# Patient Record
Sex: Male | Born: 1975 | Race: White | Hispanic: No | Marital: Single | Smoking: Current every day smoker
Health system: Southern US, Community
[De-identification: ages and names within clinical notes are randomized; demographics above are authoritative.]

## PROBLEM LIST (undated history)

## (undated) DIAGNOSIS — M1990 Unspecified osteoarthritis, unspecified site: Secondary | ICD-10-CM

---

## 1997-12-06 ENCOUNTER — Emergency Department (HOSPITAL_COMMUNITY): Admission: EM | Admit: 1997-12-06 | Discharge: 1997-12-06 | Payer: Self-pay | Admitting: Emergency Medicine

## 2001-01-31 ENCOUNTER — Emergency Department (HOSPITAL_COMMUNITY): Admission: EM | Admit: 2001-01-31 | Discharge: 2001-01-31 | Payer: Self-pay | Admitting: *Deleted

## 2001-09-05 ENCOUNTER — Encounter: Payer: Self-pay | Admitting: Emergency Medicine

## 2001-09-05 ENCOUNTER — Emergency Department (HOSPITAL_COMMUNITY): Admission: EM | Admit: 2001-09-05 | Discharge: 2001-09-05 | Payer: Self-pay | Admitting: Emergency Medicine

## 2001-10-02 ENCOUNTER — Emergency Department (HOSPITAL_COMMUNITY): Admission: EM | Admit: 2001-10-02 | Discharge: 2001-10-02 | Payer: Self-pay | Admitting: Emergency Medicine

## 2002-03-25 ENCOUNTER — Encounter: Payer: Self-pay | Admitting: Emergency Medicine

## 2002-03-25 ENCOUNTER — Emergency Department (HOSPITAL_COMMUNITY): Admission: EM | Admit: 2002-03-25 | Discharge: 2002-03-26 | Payer: Self-pay | Admitting: *Deleted

## 2002-03-30 ENCOUNTER — Emergency Department (HOSPITAL_COMMUNITY): Admission: EM | Admit: 2002-03-30 | Discharge: 2002-03-30 | Payer: Self-pay | Admitting: Emergency Medicine

## 2002-09-19 ENCOUNTER — Encounter: Payer: Self-pay | Admitting: Internal Medicine

## 2002-09-19 ENCOUNTER — Encounter: Payer: Self-pay | Admitting: Emergency Medicine

## 2002-09-19 ENCOUNTER — Inpatient Hospital Stay (HOSPITAL_COMMUNITY): Admission: EM | Admit: 2002-09-19 | Discharge: 2002-09-23 | Payer: Self-pay | Admitting: Emergency Medicine

## 2002-12-03 ENCOUNTER — Inpatient Hospital Stay (HOSPITAL_COMMUNITY): Admission: EM | Admit: 2002-12-03 | Discharge: 2002-12-06 | Payer: Self-pay | Admitting: Psychiatry

## 2003-02-07 ENCOUNTER — Encounter: Payer: Self-pay | Admitting: Emergency Medicine

## 2003-02-07 ENCOUNTER — Inpatient Hospital Stay (HOSPITAL_COMMUNITY): Admission: EM | Admit: 2003-02-07 | Discharge: 2003-02-10 | Payer: Self-pay | Admitting: Emergency Medicine

## 2003-02-08 ENCOUNTER — Encounter: Payer: Self-pay | Admitting: Internal Medicine

## 2003-03-03 ENCOUNTER — Emergency Department (HOSPITAL_COMMUNITY): Admission: EM | Admit: 2003-03-03 | Discharge: 2003-03-03 | Payer: Self-pay | Admitting: Emergency Medicine

## 2003-03-03 ENCOUNTER — Encounter: Payer: Self-pay | Admitting: Emergency Medicine

## 2003-04-11 ENCOUNTER — Emergency Department (HOSPITAL_COMMUNITY): Admission: EM | Admit: 2003-04-11 | Discharge: 2003-04-12 | Payer: Self-pay | Admitting: Emergency Medicine

## 2003-05-31 ENCOUNTER — Inpatient Hospital Stay (HOSPITAL_COMMUNITY): Admission: EM | Admit: 2003-05-31 | Discharge: 2003-06-03 | Payer: Self-pay | Admitting: Emergency Medicine

## 2003-08-08 ENCOUNTER — Emergency Department (HOSPITAL_COMMUNITY): Admission: EM | Admit: 2003-08-08 | Discharge: 2003-08-09 | Payer: Self-pay | Admitting: Unknown Physician Specialty

## 2003-09-18 ENCOUNTER — Emergency Department (HOSPITAL_COMMUNITY): Admission: EM | Admit: 2003-09-18 | Discharge: 2003-09-18 | Payer: Self-pay | Admitting: Emergency Medicine

## 2003-10-03 ENCOUNTER — Emergency Department (HOSPITAL_COMMUNITY): Admission: EM | Admit: 2003-10-03 | Discharge: 2003-10-03 | Payer: Self-pay | Admitting: *Deleted

## 2003-10-04 ENCOUNTER — Emergency Department (HOSPITAL_COMMUNITY): Admission: EM | Admit: 2003-10-04 | Discharge: 2003-10-04 | Payer: Self-pay | Admitting: Emergency Medicine

## 2003-10-14 ENCOUNTER — Emergency Department (HOSPITAL_COMMUNITY): Admission: EM | Admit: 2003-10-14 | Discharge: 2003-10-14 | Payer: Self-pay | Admitting: Emergency Medicine

## 2003-11-03 ENCOUNTER — Emergency Department (HOSPITAL_COMMUNITY): Admission: EM | Admit: 2003-11-03 | Discharge: 2003-11-03 | Payer: Self-pay | Admitting: Emergency Medicine

## 2003-11-16 ENCOUNTER — Emergency Department (HOSPITAL_COMMUNITY): Admission: EM | Admit: 2003-11-16 | Discharge: 2003-11-16 | Payer: Self-pay | Admitting: Emergency Medicine

## 2003-11-17 ENCOUNTER — Emergency Department (HOSPITAL_COMMUNITY): Admission: EM | Admit: 2003-11-17 | Discharge: 2003-11-17 | Payer: Self-pay | Admitting: Emergency Medicine

## 2003-11-19 ENCOUNTER — Emergency Department (HOSPITAL_COMMUNITY): Admission: EM | Admit: 2003-11-19 | Discharge: 2003-11-19 | Payer: Self-pay | Admitting: Family Medicine

## 2003-12-25 ENCOUNTER — Emergency Department (HOSPITAL_COMMUNITY): Admission: EM | Admit: 2003-12-25 | Discharge: 2003-12-25 | Payer: Self-pay | Admitting: Emergency Medicine

## 2004-01-06 ENCOUNTER — Emergency Department (HOSPITAL_COMMUNITY): Admission: EM | Admit: 2004-01-06 | Discharge: 2004-01-06 | Payer: Self-pay | Admitting: Emergency Medicine

## 2004-01-12 ENCOUNTER — Inpatient Hospital Stay (HOSPITAL_COMMUNITY): Admission: EM | Admit: 2004-01-12 | Discharge: 2004-01-16 | Payer: Self-pay | Admitting: Emergency Medicine

## 2006-01-24 IMAGING — CR DG KNEE COMPLETE 4+V*L*
4 series · 4 of 4 positions shown · non-contrast
Comparison: none

CLINICAL DATA: Left knee pain and instability, fall last night.
 COMPLETE LEFT KNEE 10/04/03 
 Four views of the left knee demonstrate a small to moderate-sized effusion.  Also demonstrated is an avulsion fracture of the medial aspect of the distal femoral metaphysis.  It is difficult to determine if this is an acute fracture or an old fracture.  On one of the oblique views, this appears to represent an old, healed fracture.  Minimal medial spur formation is also noted. 
 IMPRESSION
 1.  Probable old avulsion fracture of the medial aspect of the distal femoral metaphysis.  An acute fracture is less likely.  Correlation with the clinical findings is necessary.
 2.  Small to moderate-sized effusion. 
 3.  Minimal medial spur formation.

[view not recorded (1 of 4)]
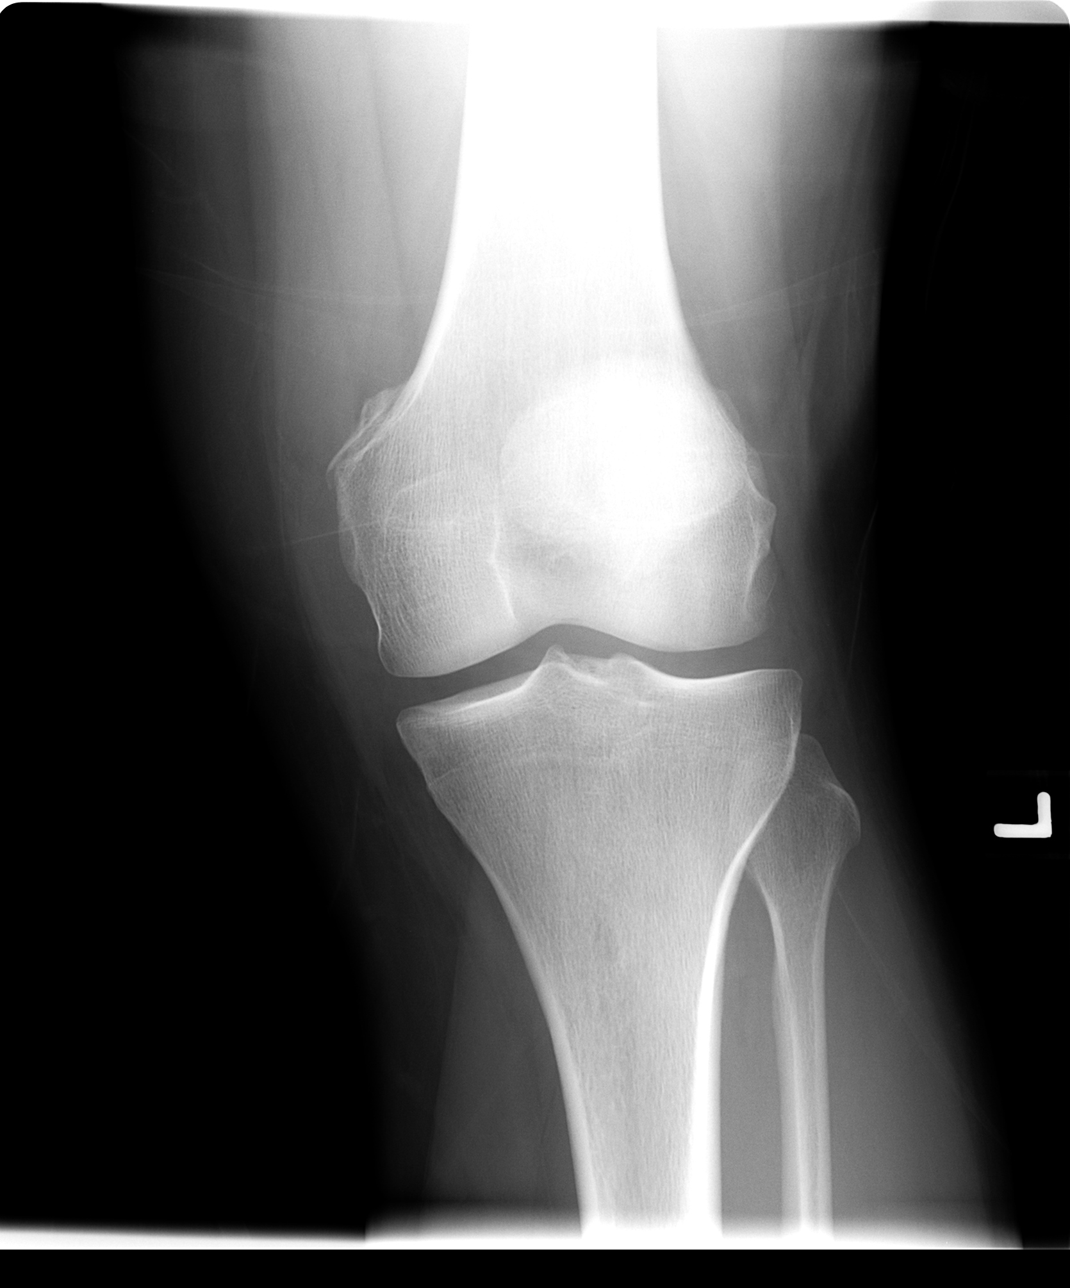

[view not recorded (2 of 4)]
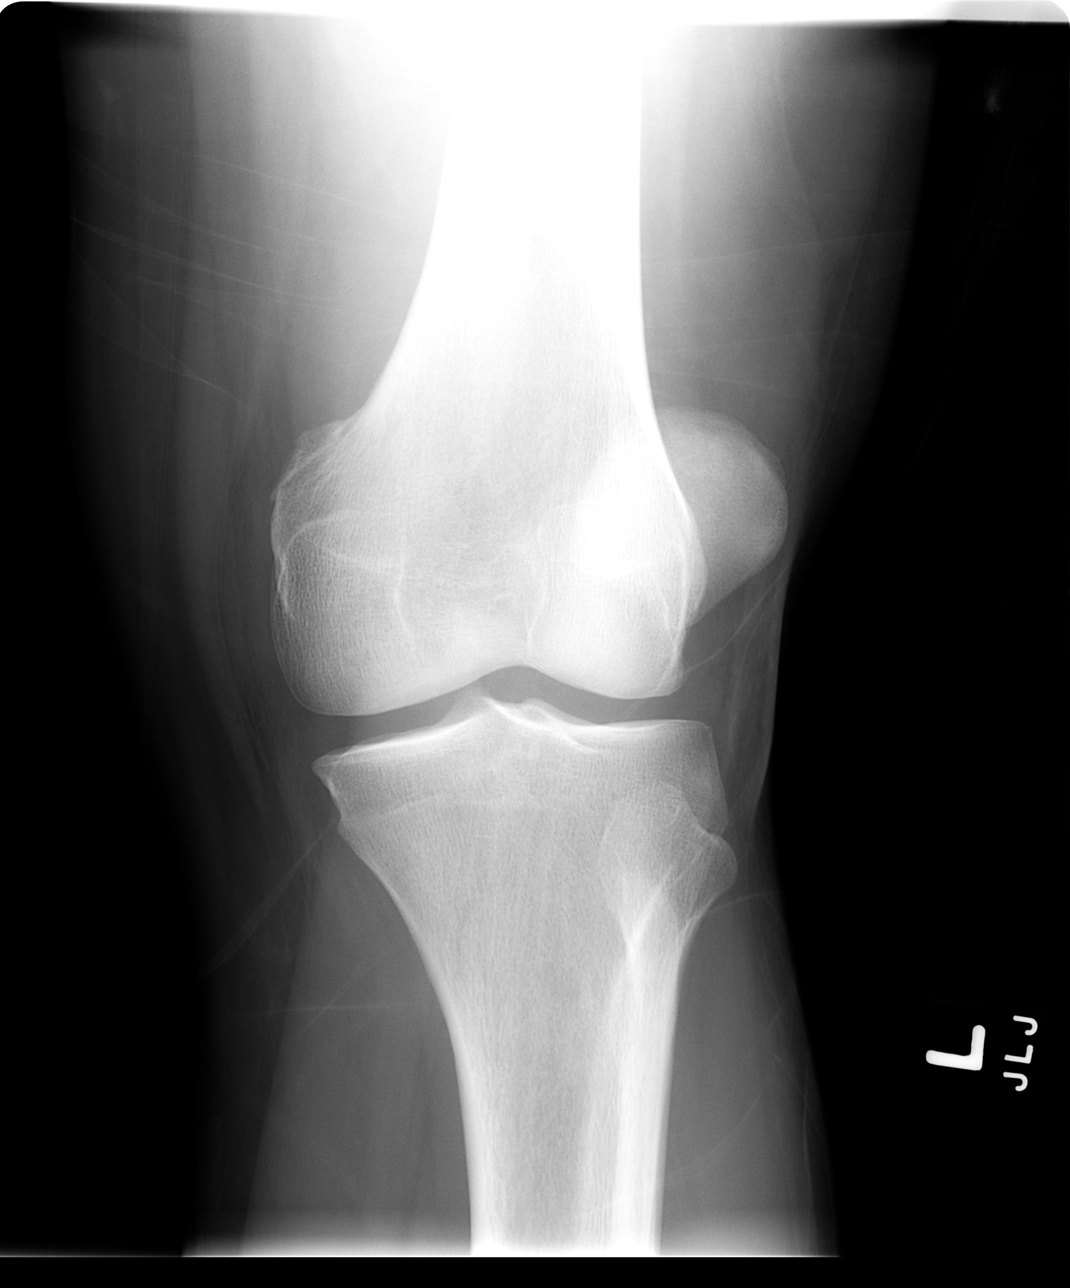

[view not recorded (3 of 4)]
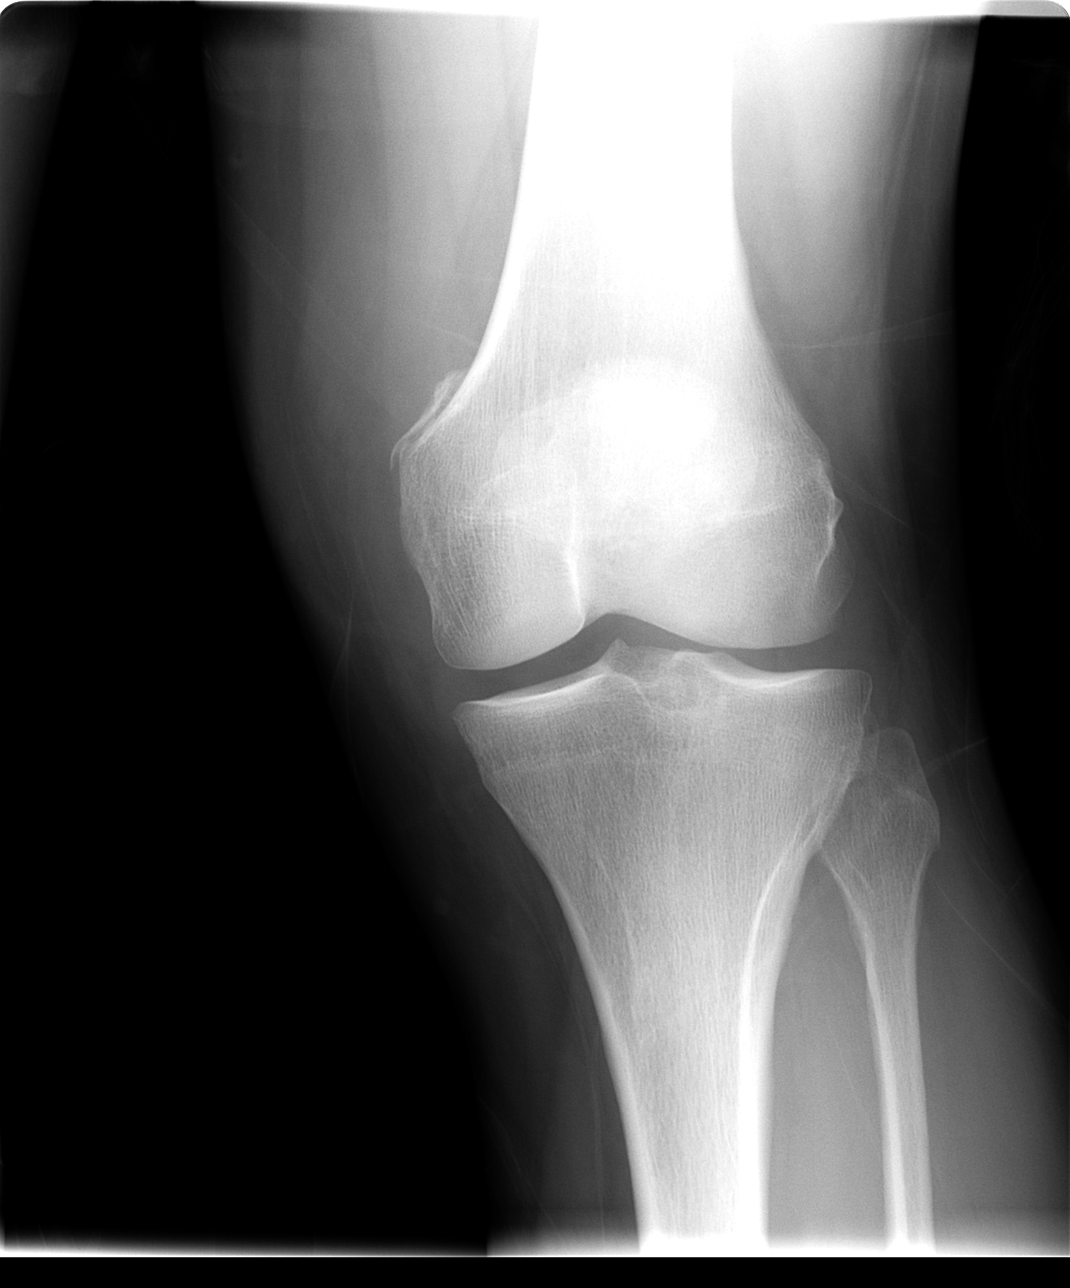

[view not recorded (4 of 4)]
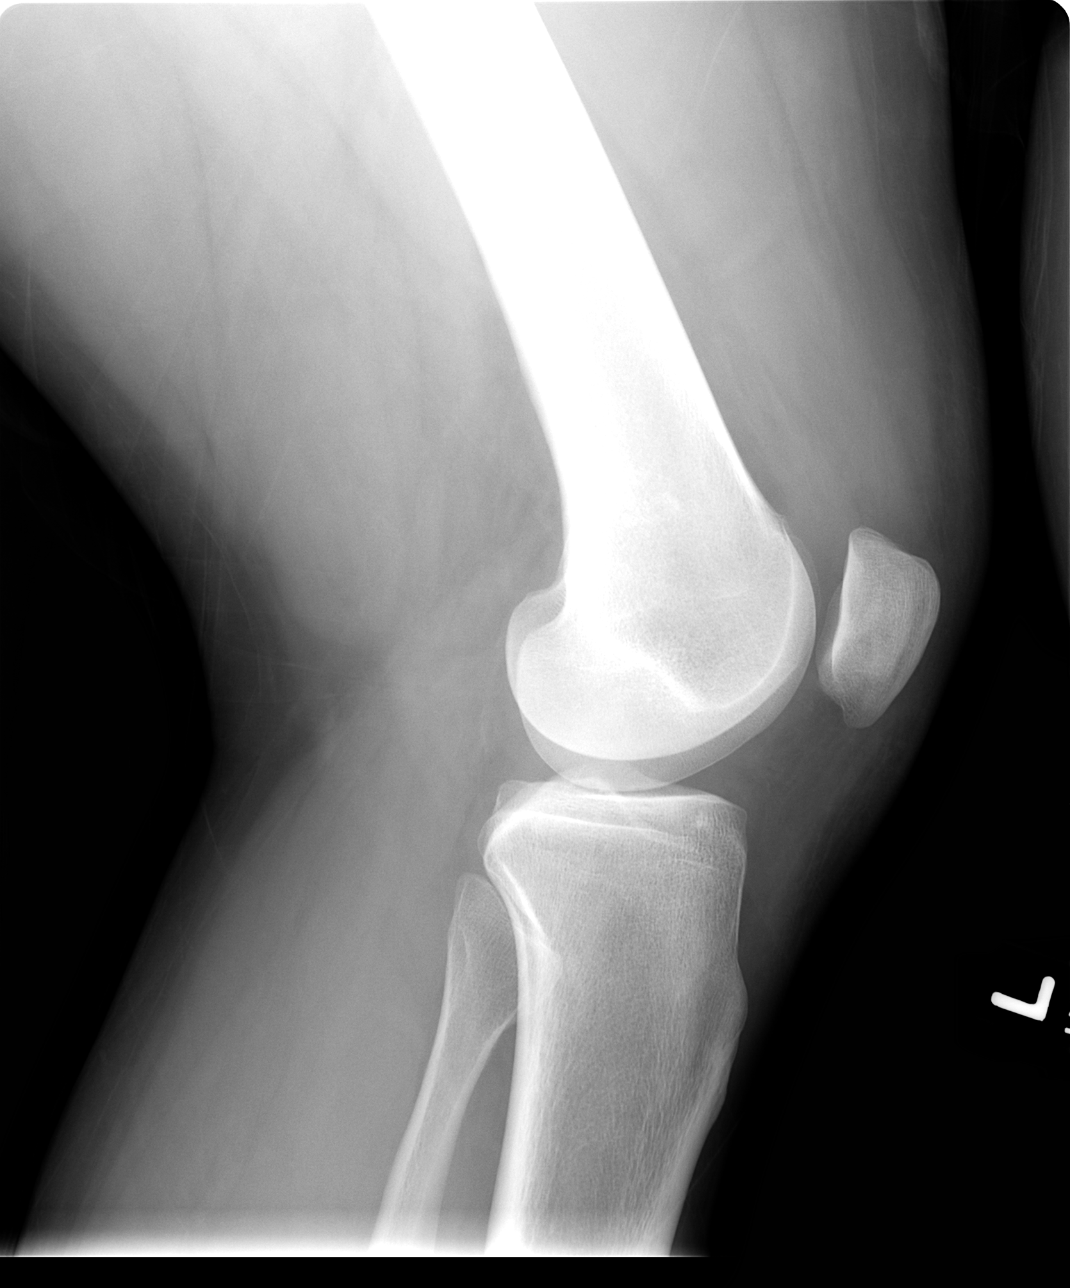

[4 of 4 positions shown; findings below may reference images not displayed]

## 2011-01-01 ENCOUNTER — Emergency Department (HOSPITAL_COMMUNITY): Payer: Self-pay

## 2011-01-01 ENCOUNTER — Emergency Department (HOSPITAL_COMMUNITY)
Admission: EM | Admit: 2011-01-01 | Discharge: 2011-01-01 | Disposition: A | Discharge status: Home / Self Care | Payer: Self-pay | Attending: Emergency Medicine | Admitting: Emergency Medicine

## 2011-01-01 DIAGNOSIS — R300 Dysuria: Secondary | ICD-10-CM | POA: Insufficient documentation

## 2011-01-01 DIAGNOSIS — R61 Generalized hyperhidrosis: Secondary | ICD-10-CM | POA: Insufficient documentation

## 2011-01-01 DIAGNOSIS — R350 Frequency of micturition: Secondary | ICD-10-CM | POA: Insufficient documentation

## 2011-01-01 DIAGNOSIS — R109 Unspecified abdominal pain: Secondary | ICD-10-CM | POA: Insufficient documentation

## 2011-01-01 DIAGNOSIS — R319 Hematuria, unspecified: Secondary | ICD-10-CM | POA: Insufficient documentation

## 2011-01-01 DIAGNOSIS — J984 Other disorders of lung: Secondary | ICD-10-CM | POA: Insufficient documentation

## 2011-01-01 LAB — URINALYSIS, ROUTINE W REFLEX MICROSCOPIC
Bilirubin Urine: NEGATIVE
Glucose, UA: NEGATIVE mg/dL
Leukocytes, UA: NEGATIVE
Specific Gravity, Urine: 1.019 (ref 1.005–1.030)
Urobilinogen, UA: 1 mg/dL (ref 0.0–1.0)

## 2011-01-01 LAB — POCT I-STAT, CHEM 8
BUN: 14 mg/dL (ref 6–23)
Calcium, Ion: 1.22 mmol/L (ref 1.12–1.32)
Chloride: 102 mEq/L (ref 96–112)
Creatinine, Ser: 1.1 mg/dL (ref 0.50–1.35)
Hemoglobin: 16.7 g/dL (ref 13.0–17.0)

## 2011-01-01 LAB — URINE MICROSCOPIC-ADD ON

## 2011-02-21 ENCOUNTER — Emergency Department (HOSPITAL_COMMUNITY)
Admission: EM | Admit: 2011-02-21 | Discharge: 2011-02-21 | Disposition: A | Discharge status: Other Acute Inpatient Hospital | Payer: Self-pay | Attending: Emergency Medicine | Admitting: Emergency Medicine

## 2011-02-21 ENCOUNTER — Inpatient Hospital Stay (HOSPITAL_COMMUNITY)
Admission: RE | Admit: 2011-02-21 | Discharge: 2011-02-24 | DRG: 897 | Disposition: A | Discharge status: Home / Self Care | Payer: Self-pay | Source: Ambulatory Visit | Attending: Psychiatry | Admitting: Psychiatry

## 2011-02-21 ENCOUNTER — Inpatient Hospital Stay (HOSPITAL_COMMUNITY): Admission: AD | Admit: 2011-02-21 | Payer: Self-pay | Source: Ambulatory Visit | Admitting: Psychiatry

## 2011-02-21 DIAGNOSIS — Z6379 Other stressful life events affecting family and household: Secondary | ICD-10-CM

## 2011-02-21 DIAGNOSIS — F1120 Opioid dependence, uncomplicated: Principal | ICD-10-CM

## 2011-02-21 DIAGNOSIS — R112 Nausea with vomiting, unspecified: Secondary | ICD-10-CM | POA: Insufficient documentation

## 2011-02-21 DIAGNOSIS — F411 Generalized anxiety disorder: Secondary | ICD-10-CM | POA: Insufficient documentation

## 2011-02-21 DIAGNOSIS — F1020 Alcohol dependence, uncomplicated: Secondary | ICD-10-CM | POA: Insufficient documentation

## 2011-02-21 LAB — RAPID URINE DRUG SCREEN, HOSP PERFORMED
Amphetamines: NOT DETECTED
Benzodiazepines: NOT DETECTED
Tetrahydrocannabinol: POSITIVE — AB

## 2011-02-21 LAB — DIFFERENTIAL
Basophils Relative: 0 % (ref 0–1)
Lymphocytes Relative: 19 % (ref 12–46)
Lymphs Abs: 1.5 10*3/uL (ref 0.7–4.0)
Monocytes Absolute: 0.4 10*3/uL (ref 0.1–1.0)
Neutrophils Relative %: 75 % (ref 43–77)

## 2011-02-21 LAB — BASIC METABOLIC PANEL
CO2: 26 mEq/L (ref 19–32)
Calcium: 9.8 mg/dL (ref 8.4–10.5)
Creatinine, Ser: 0.79 mg/dL (ref 0.50–1.35)
GFR calc Af Amer: >60 mL/min (ref >60)
Glucose, Bld: 92 mg/dL (ref 70–99)

## 2011-02-21 LAB — CBC
Hemoglobin: 14.9 g/dL (ref 13.0–17.0)
MCV: 86.2 fL (ref 78.0–100.0)
Platelets: 304 10*3/uL (ref 150–400)
RBC: 4.99 MIL/uL (ref 4.22–5.81)
WBC: 7.5 10*3/uL (ref 4.0–10.5)

## 2011-02-21 LAB — ETHANOL: Alcohol, Ethyl (B): <11 mg/dL (ref 0–11)

## 2011-02-22 DIAGNOSIS — F1120 Opioid dependence, uncomplicated: Secondary | ICD-10-CM

## 2011-02-24 NOTE — Op Note (Signed)
[name], [name] NO.:  [id number]  MEDICAL RECORD NO.:  [id number]  LOCATION:                                 FACILITY:  PHYSICIAN:  Feliberto Gottron. Turner Daniels, M.D.   DATE OF BIRTH:  Oct 04, 1975  DATE OF PROCEDURE:  02/22/2011 DATE OF DISCHARGE:                              OPERATIVE REPORT   PREOPERATIVE DIAGNOSIS:  Possible loose body, right knee with chondromalacia.  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  PROCEDURE:  Right knee arthroscopic partial lateral meniscectomy, chondromalacia grade 3 focal, grade 4 to the apex, patella with abrasion arthroplasty, removal of loose bodies.  SURGEON:  Feliberto Gottron.  Turner Daniels, MD  FIRST ASSISTANT:  Shirl Harris, PA-C  ANESTHETIC:  Local with IV sedation.  ESTIMATED BLOOD LOSS:  Minimal.  FLUID REPLACEMENT:  800 mL crystalloid.  DRAINS PLACED:  None.TOURNIQUET TIME:  None.  INDICATIONS FOR PROCEDURE:  A 35 year old gentleman with mechanical catching, popping, and pain in his right knee, was [facility] by Dr[facility] Dar[facility] and felt to have a possible loose body in his right knee and sent for orthopedic consultation.  Plain radiographs showed minimal arthritic changes, good articular cartilage height, and little if any peripheral spurring.  He did have patellofemoral apprehension and was tender along the anteromedial and anterolateral joint line more anterolateral and anteromedial.  He failed conservative measures with observation, anti-inflammatory medicine, and the pain was bad now affecting his ability to do chores around the house as well as running which was one of his favored activities.  Because of these findings, he desires elective arthroscopic evaluation and treatment of his right knee.  Risks and benefits of surgery have  been discussed and questions answered.  DESCRIPTION OF PROCEDURE:  The patient was identified by armband and received local anesthetic block in the holding area at [facility].  He was then taken to the operating room where the appropriate site monitors were attached and IV sedation was administered.  Lateral post applied to the table and the right lower extremity prepped and draped in usual sterile fashion from the ankle to the midthigh.  Time- out procedure performed.  We began the operation itself by making standard inferomedial and inferolateral peripatellar portals allowing introduction of the arthroscope through the inferolateral portal and the outflow through the inferomedial portal.  Diagnostic arthroscopy revealed grade 3 focal, grade 4 chondromalacia of the apex of the patella and some intra-articular chondral loose bodies with the donor site being the patella.  We debrided the patella chondromalacia back to stable margin removing the flap tears and performing abrasion arthroplasty.  The trochlea was in good condition.  Moving to the medial compartment, the articular and meniscal cartilages were in good condition.  Going to the notch, the ACL and PCL were intact.  On the lateral side, the patient had a fairly large parrot beak tear of the anterior horn of lateral meniscus as well as inflamed synovium.  All this was debrided back to a stable margin and the  lateral meniscus root also had a partial tear that required debridement with the small and large biters and then cleaning with a 35 gator sucker shaver.  The articular cartilage on the lateral side had some very light grade 1-2 chondromalacia, but overall was in good condition.  The gutters were cleared medially and laterally.  The knee was irrigated out with normal saline solution.  The arthroscopic instruments were removed and a dressing of Xeroform, 4x4 dressing, sponges, Webril, and Ace wrap applied.  The  patient was then awakened and taken to the recovery room without difficulty.     Feliberto Gottron. Turner Daniels, M.D.     Ovid Curd  D:  02/23/2011  T:  02/23/2011  Job:  [id number]  Electronically Signed by Gean Birchwood M.D. on 02/24/2011 08:52:57 AM

## 2011-02-25 NOTE — Assessment & Plan Note (Signed)
  NAMEJOHNEL, YIELDING NO.:  [id number]  MEDICAL RECORD NO.:  [id number]  LOCATION:  1610                          FACILITY:  [facility]  PHYSICIAN:  Orson Aloe, MD       DATE OF BIRTH:  [birth year]  DATE OF ADMISSION:  02/21/2011 DATE OF DISCHARGE:                      PSYCHIATRIC ADMISSION ASSESSMENT   IDENTIFYING INFORMATION:  A 35 year old male.  This is a voluntary admission.  HISTORY OF PRESENT ILLNESS:  Layken presents, requesting detox from opiates.  He relapsed on heroin and is currently using about 200 dollars' worth daily, having problems with anxiety, sweats, and muscle cramping when he attempts to stop.  He had started to steal to support his habit; recognizes that it is wrong; wants help getting safely off the opiates to live a straight life and avoid going to prison again.  He has a history of a previous incarceration for 7 years for armed robbery. He denies suicidal thoughts today.  PAST PSYCHIATRIC HISTORY:  He does have a history of previous hospitalizations three times for mood disorder, suicidal thoughts, and substance abuse; previously hospitalized in 2004 at [facility], in 2004 at [facility]; and as an adolescent, he was hospitalized for conduct problems at [facility].  FAMILY HISTORY:  Multiple cousins with a history of substance abuse.  MEDICAL HISTORY:  No regular primary care physician.  Chronic medical problems are none.  MEDICAL EVALUATION:  A full physical exam was done in the Emergency Room, as noted in the record.  This is a normally-developed male; smooth motor; no abnormal movements.  On Review of Systems, he reports that he both injects and snorts heroin with daily use and has now been using for about 3 months.  He complains of severe nausea, vomiting, generalized muscle spasms, and severe anxiety when he tries to cut back or stop.  Urine drug screen positive for opiates and cannabis metabolites.   CBC normal.  Alcohol screen negative and chemistries normal with BUN 10, creatinine 0.79.  Current medications are none.  Drug allergies are none.  MENTAL STATUS EXAM:  A fully alert male, cooperative, in full contact with reality, denying any suicidal or homicidal thoughts.  Thinking is non-psychotic, in full contact with reality.  Requests help with detox.  AXIS I:  Opiate dependence. AXIS II:  Deferred. AXIS III:  Current IV drug abuse. AXIS IV:  Deferred. AXIS V:  Current 50; past year not known.  The plan is to voluntarily admit him.  We will start him on a clonidine protocol with a goal of a safe detox in 5 days.     Margaret A. Lorin Picket, N.P.   ______________________________ Orson Aloe, MD    MAS/MEDQ  D:  02/22/2011  T:  02/22/2011  Job:  [id number]  Electronically Signed by Kari Baars N.P. on 02/24/2011 09:81:19 PM Electronically Signed by Orson Aloe  on 02/25/2011 10:58:37 AM

## 2011-02-25 NOTE — Discharge Summary (Signed)
NAMEGRAINGER, [name] NO.:  [id number]  MEDICAL RECORD NO.:  [id number]  LOCATION:  1610                          FACILITY:  [facility]  PHYSICIAN:  Orson Aloe, MD       DATE OF BIRTH:  August 24, 1975  DATE OF ADMISSION:  02/21/2011 DATE OF DISCHARGE:  02/24/2011                              DISCHARGE SUMMARY   This 35 year old male was admitted voluntarily requesting detox from opiates.  He relapsed on heroin and is currently using about 200 dollars' worth a day, having problems with anxiety, sweats and muscle cramps when he attempts to stop.  He started to steal to support his habit and recognized that was wrong.  He wants to get  safely off the opiates to live a straight life and avoid going to prison again.  He has a history of previous incarceration for 7 years for armed robbery.  He denies suicidal ideation today.  PAST HISTORY:  He had been, in 2004, at [facility].  As an adolescent, he was hospitalized for conduct problems at [facility].  ADMITTING DIAGNOSES:  Axis I:  Opiate dependence. Axis II:  Deferred. Axis III:  Current IV drug use. Axis IV:  Deferred. Axis V:  50.  He was admitted and given trazodone as needed for bedtime and placed on the clonidine detox with good results and also was on nicotine patch. There was concern by the father that he would have visitors come and bring him drugs, particularly two different girlfriends who had reportedly smuggled drugs in to him when he was in prison and some other characters.  Permitted visitors were his father, his mother and his grandmother.  He was discharged home to follow up at [facility] and given prescriptions.  Case manager noted that he openly discussed coming to get help, receiving NA meeting information.  His father met with the social worker and expressed concerns that he would have visitors who might slip drugs to him.  He did not get up for morning group 2 days in a row.  I  challenged the patient to attend the morning group, since that is where discharge planning takes place.  He is getting through withdrawals pretty well.  He had a tough first 2 days.  His anxiety was not like he thought it would be, and it was better than what he thought it was going to be, and his depression was none, and he denied any homicidal or suicidal ideation through the hospital stay.  He was agreeable to intensive outpatient after discharge.  CONDITION ON DISCHARGE:  He denied any suicidal or homicidal ideation. Denied any hallucinations, illusions, delusions.  He had good eye contact.  Able to focus well in the one-to-one setting as well as in group.  He had clear, goal-directed thoughts.  He had natural conversational speech, volume, rate and tone.  He was oriented x4.  He had recent and remote memory that were intact.  Judgment - He was able to construct a fairly safe aftercare plan and wants to go to IOP.  His insight seemed to be improved from admission.  RECOMMENDATIONS:  Resume typical activities.  Resume typical diet.  1. Continue clonidine 0.1 mg 1 at night and 1 twice a day on the 27th     and 1 in the morning on the 28th and 29th. 2. Continue Bentyl 20 mg as needed for upset stomach. 3. Vistaril 25 mg take every 6 hours as needed. 4. Nicotine patch.  He decided he did want to stop the nicotine, as it     was a trigger for him to return to relapse and use again.  DISCHARGE PLACEMENT:  [facility], [address]  The phone number was given.  He was instructed to ask for IOP, and he had an appointment for September the 28th at 9:00 p.m.  Schedule and brochure of NA meetings was given to him at discharge.          ______________________________ Orson Aloe, MD     EW/MEDQ  D:  02/24/2011  T:  02/25/2011  Job:  [id number]  Electronically Signed by Orson Aloe  on 02/25/2011 10:58:46 AM

## 2011-07-04 ENCOUNTER — Emergency Department (HOSPITAL_COMMUNITY): Payer: Self-pay

## 2011-07-04 ENCOUNTER — Encounter (HOSPITAL_COMMUNITY): Payer: Self-pay | Admitting: *Deleted

## 2011-07-04 ENCOUNTER — Other Ambulatory Visit: Payer: Self-pay

## 2011-07-04 ENCOUNTER — Emergency Department (HOSPITAL_COMMUNITY)
Admission: EM | Admit: 2011-07-04 | Discharge: 2011-07-04 | Disposition: A | Discharge status: Home / Self Care | Payer: Self-pay | Attending: Emergency Medicine | Admitting: Emergency Medicine

## 2011-07-04 DIAGNOSIS — F1910 Other psychoactive substance abuse, uncomplicated: Secondary | ICD-10-CM

## 2011-07-04 DIAGNOSIS — S0003XA Contusion of scalp, initial encounter: Secondary | ICD-10-CM | POA: Insufficient documentation

## 2011-07-04 DIAGNOSIS — R5381 Other malaise: Secondary | ICD-10-CM | POA: Insufficient documentation

## 2011-07-04 DIAGNOSIS — S1093XA Contusion of unspecified part of neck, initial encounter: Secondary | ICD-10-CM | POA: Insufficient documentation

## 2011-07-04 DIAGNOSIS — T401X4A Poisoning by heroin, undetermined, initial encounter: Secondary | ICD-10-CM | POA: Insufficient documentation

## 2011-07-04 DIAGNOSIS — R5383 Other fatigue: Secondary | ICD-10-CM | POA: Insufficient documentation

## 2011-07-04 DIAGNOSIS — T401X1A Poisoning by heroin, accidental (unintentional), initial encounter: Secondary | ICD-10-CM | POA: Insufficient documentation

## 2011-07-04 DIAGNOSIS — T424X1A Poisoning by benzodiazepines, accidental (unintentional), initial encounter: Secondary | ICD-10-CM | POA: Insufficient documentation

## 2011-07-04 DIAGNOSIS — T50901A Poisoning by unspecified drugs, medicaments and biological substances, accidental (unintentional), initial encounter: Secondary | ICD-10-CM

## 2011-07-04 DIAGNOSIS — T424X4A Poisoning by benzodiazepines, undetermined, initial encounter: Secondary | ICD-10-CM | POA: Insufficient documentation

## 2011-07-04 DIAGNOSIS — T507X1A Poisoning by analeptics and opioid receptor antagonists, accidental (unintentional), initial encounter: Secondary | ICD-10-CM | POA: Insufficient documentation

## 2011-07-04 DIAGNOSIS — T43601A Poisoning by unspecified psychostimulants, accidental (unintentional), initial encounter: Secondary | ICD-10-CM | POA: Insufficient documentation

## 2011-07-04 DIAGNOSIS — S0180XA Unspecified open wound of other part of head, initial encounter: Secondary | ICD-10-CM | POA: Insufficient documentation

## 2011-07-04 DIAGNOSIS — R55 Syncope and collapse: Secondary | ICD-10-CM | POA: Insufficient documentation

## 2011-07-04 HISTORY — DX: Unspecified osteoarthritis, unspecified site: M19.90

## 2011-07-04 LAB — COMPREHENSIVE METABOLIC PANEL
ALT: 15 U/L (ref 0–53)
AST: 18 U/L (ref 0–37)
Albumin: 3.5 g/dL (ref 3.5–5.2)
Alkaline Phosphatase: 41 U/L (ref 39–117)
Calcium: 9.1 mg/dL (ref 8.4–10.5)
Potassium: 3.3 mEq/L — ABNORMAL LOW (ref 3.5–5.1)
Sodium: 140 mEq/L (ref 135–145)
Total Protein: 6.6 g/dL (ref 6.0–8.3)

## 2011-07-04 LAB — CBC
Hemoglobin: 13.7 g/dL (ref 13.0–17.0)
MCH: 30.2 pg (ref 26.0–34.0)
MCHC: 35 g/dL (ref 30.0–36.0)
Platelets: 255 10*3/uL (ref 150–400)

## 2011-07-04 LAB — SALICYLATE LEVEL: Salicylate Lvl: <2 mg/dL — ABNORMAL LOW (ref 2.8–20.0)

## 2011-07-04 LAB — ACETAMINOPHEN LEVEL: Acetaminophen (Tylenol), Serum: <15 ug/mL (ref 10–30)

## 2011-07-04 MED ORDER — NALOXONE HCL 1 MG/ML IJ SOLN
INTRAMUSCULAR | Status: AC
  Filled 2011-07-04: qty 2

## 2011-07-04 MED ORDER — SODIUM CHLORIDE 0.9 % IV SOLN
INTRAVENOUS | Status: DC
  Administered 2011-07-04: 1000 mL via INTRAVENOUS

## 2011-07-04 MED ORDER — ONDANSETRON HCL 4 MG/2ML IJ SOLN
4.0000 mg | Freq: Once | INTRAMUSCULAR | Status: AC
  Administered 2011-07-04: 4 mg via INTRAVENOUS
  Filled 2011-07-04: qty 2

## 2011-07-04 MED ORDER — SODIUM CHLORIDE 0.9 % IV BOLUS (SEPSIS)
500.0000 mL | Freq: Once | INTRAVENOUS | Status: AC
  Administered 2011-07-04: 500 mL via INTRAVENOUS

## 2011-07-04 MED ORDER — NALOXONE HCL 1 MG/ML IJ SOLN
INTRAMUSCULAR | Status: AC
  Filled 2011-07-04: qty 4

## 2011-07-04 NOTE — ED Provider Notes (Addendum)
History     CSN: [id number]  Arrival date & time 07/04/11  4098   First MD Initiated Contact with Patient 07/04/11 [phone]      Chief Complaint  Patient presents with  . Drug Overdose    (Consider location/radiation/quality/duration/timing/severity/associated sxs/prior treatment) Patient is a 36 y.o. male presenting with Overdose. The history is provided by the patient, the EMS personnel and a relative.  Drug Overdose This is a new problem. The current episode started less than 1 hour ago. The problem has been rapidly improving. Pertinent negatives include no chest pain, no abdominal pain, no headaches and no shortness of breath. The symptoms are aggravated by nothing. The symptoms are relieved by nothing.   Brought in by EMS following the collapse unresponsive at home family members thought that he was in cardiac arrest started CPR upon arrival of EMS patient had a pulse and shallow breathing after Narcan 4 mg patient awoke. Patient states that he had used heroin. Also admits to taking 2 Xanax. Denies any other ingestion. Patient denies it being a suicide attempt. Denies any suicidal ideation.  Past Medical History  Diagnosis Date  . Arthritis     History reviewed. No pertinent past surgical history.  History reviewed. No pertinent family history.  History  Substance Use Topics  . Smoking status: Current Everyday Smoker -- 1.0 packs/day  . Smokeless tobacco: Never Used  . Alcohol Use: 0.6 oz/week    1 Cans of beer per week      Review of Systems  Constitutional: Negative for fever and chills.  HENT: Negative for congestion, neck pain and neck stiffness.   Eyes: Negative for visual disturbance.  Respiratory: Negative for cough, choking and shortness of breath.   Cardiovascular: Negative for chest pain.  Gastrointestinal: Negative for nausea, vomiting, abdominal pain and diarrhea.  Genitourinary: Negative for dysuria.  Musculoskeletal: Negative for back pain.  Skin:  Negative for rash.  Neurological: Negative for facial asymmetry, speech difficulty, weakness and headaches.  Hematological: Does not bruise/bleed easily.    Allergies  Review of patient's allergies indicates no known allergies.  Home Medications  No current outpatient prescriptions on file.  BP 112/84  Pulse 86  Temp 97.6 F (36.4 C)  Resp 18  Ht 5\' 9"  (1.753 m)  Wt 200 lb (90.719 kg)  BMI 29.53 kg/m2  SpO2 99%  Physical Exam  Nursing note and vitals reviewed. Constitutional: He is oriented to person, place, and time. He appears well-developed and well-nourished.  HENT:  Head: Normocephalic and atraumatic.  Mouth/Throat: Oropharynx is clear and moist.       Head atraumatic except for bilateral bruising in the temporal area and upper part of the cheek, left lateral eyebrow with a 3 mm superficial laceration no bleeding.  Eyes: Conjunctivae and EOM are normal.  Neck: Normal range of motion. Neck supple.  Cardiovascular: Normal rate, regular rhythm, normal heart sounds and intact distal pulses.   No murmur heard. Pulmonary/Chest: Effort normal and breath sounds normal. No respiratory distress. He has no wheezes. He has no rales.  Abdominal: Soft. Bowel sounds are normal. There is no tenderness.  Musculoskeletal: Normal range of motion. He exhibits no edema and no tenderness.  Neurological: He is alert and oriented to person, place, and time. No cranial nerve deficit. He exhibits normal muscle tone. Coordination normal.  Skin: Skin is warm. No rash noted.    ED Course  Procedures (including critical care time)  Labs Reviewed  CBC - Abnormal; Notable for the  following:    WBC 12.6 (*)    All other components within normal limits  COMPREHENSIVE METABOLIC PANEL - Abnormal; Notable for the following:    Potassium 3.3 (*)    All other components within normal limits  SALICYLATE LEVEL - Abnormal; Notable for the following:    Salicylate Lvl <2.0 (*)    All other components  within normal limits  LIPASE, BLOOD  ETHANOL  ACETAMINOPHEN LEVEL  URINE RAPID DRUG SCREEN (HOSP PERFORMED)  URINALYSIS, WITH MICROSCOPIC     Date: 07/04/2011  Rate: 99  Rhythm: normal sinus rhythm  QRS Axis: normal  Intervals: normal  ST/T Wave abnormalities: nonspecific ST changes  Conduction Disutrbances:none  Narrative Interpretation:   Old EKG Reviewed: none available   Ct Head Wo Contrast  07/04/2011  *RADIOLOGY REPORT*  Clinical Data: Heroin overdose, weakness.  CT HEAD WITHOUT CONTRAST  Technique:  Contiguous axial images were obtained from the base of the skull through the vertex without contrast.  Comparison: 03/03/2003 report  Findings: There is no evidence for acute hemorrhage, hydrocephalus, mass lesion, or abnormal extra-axial fluid collection.  No definite CT evidence for acute infarction.  Remote left lamina papyracea fracture. The visualized paranasal sinuses and mastoid air cells are predominately clear.  IMPRESSION: No acute intracranial abnormality.  Original Report Authenticated By: Waneta Martins, M.D.   Dg Chest Portable 1 View  07/04/2011  *RADIOLOGY REPORT*  Clinical Data: Drug overdose  PORTABLE CHEST - 1 VIEW  Comparison: None.  Findings: Lungs are clear. No pleural effusion or pneumothorax. The cardiomediastinal contours are within normal limits. The visualized bones and soft tissues are without significant appreciable abnormality.  IMPRESSION: No acute cardiopulmonary process.  Original Report Authenticated By: Waneta Martins, M.D.   Results for orders placed during the hospital encounter of 07/04/11  CBC      Component Value Range   WBC 12.6 (*) 4.0 - 10.5 (K/uL)   RBC 4.53  4.22 - 5.81 (MIL/uL)   Hemoglobin 13.7  13.0 - 17.0 (g/dL)   HCT 16.1  09.6 - 04.5 (%)   MCV 86.3  78.0 - 100.0 (fL)   MCH 30.2  26.0 - 34.0 (pg)   MCHC 35.0  30.0 - 36.0 (g/dL)   RDW 40.9  81.1 - 91.4 (%)   Platelets 255  150 - 400 (K/uL)  COMPREHENSIVE METABOLIC PANEL       Component Value Range   Sodium 140  135 - 145 (mEq/L)   Potassium 3.3 (*) 3.5 - 5.1 (mEq/L)   Chloride 106  96 - 112 (mEq/L)   CO2 24  19 - 32 (mEq/L)   Glucose, Bld 83  70 - 99 (mg/dL)   BUN 8  6 - 23 (mg/dL)   Creatinine, Ser 7.82  0.50 - 1.35 (mg/dL)   Calcium 9.1  8.4 - 95.6 (mg/dL)   Total Protein 6.6  6.0 - 8.3 (g/dL)   Albumin 3.5  3.5 - 5.2 (g/dL)   AST 18  0 - 37 (U/L)   ALT 15  0 - 53 (U/L)   Alkaline Phosphatase 41  39 - 117 (U/L)   Total Bilirubin 0.3  0.3 - 1.2 (mg/dL)   GFR calc non Af Amer >90  >90 (mL/min)   GFR calc Af Amer >90  >90 (mL/min)  LIPASE, BLOOD      Component Value Range   Lipase 31  11 - 59 (U/L)  ETHANOL      Component Value Range   Alcohol, Ethyl (  B) <11  0 - 11 (mg/dL)  ACETAMINOPHEN LEVEL      Component Value Range   Acetaminophen (Tylenol), Serum <15.0  10 - 30 (ug/mL)  SALICYLATE LEVEL      Component Value Range   Salicylate Lvl <2.0 (*) 2.8 - 20.0 (mg/dL)     1. Overdose       MDM   Brought in by EMS found unresponsive family members heard him collapse in the floor they were in their 66s and cardiac arrest started CPR when EMS arrived patient had a pulse gave Narcan and patient woke up patient essentially admitted to using heroin denies initially any suicidal thoughts or suicidal intent family members have arrived and have raised concerns for a suicide attempt. Telemedicine psychiatric consult will be obtained to evaluate the patient's suicidal potential.  Clinically it seemed as if to his narcotic overdose and probably related to heroin use. No evidence of Tylenol alcohol or salicylate ingestion liver function tests are normal electrolytes are normal other than a mild decrease in potassium at 3.3. During to 3 hours patient in the emergency department he has remained alert and awake but has not required additional Narcan. Urine drug screen is still pending.  Contusion and bruising was noted on both the sides of his temporal and face  head CT was ordered that was negative. Patient still not totally medically cleared will require observation for another 2 hours in addition may require admission from a psychiatric standpoint.        Shelda Jakes, MD 07/04/11 1610  Shelda Jakes, MD 07/05/11 [id number]

## 2011-07-04 NOTE — ED Notes (Signed)
Patient transported to X-ray 

## 2011-07-04 NOTE — ED Notes (Signed)
Pt took heroin after not taking for a few weeks. Family found pt and initiated CPR. EMS found pt pt with pulse and sonorus breathing pattern. Pt currently alert and oriented x 4. Skin warm dry, non tenting.

## 2011-07-04 NOTE — Progress Notes (Signed)
Patient's sister, Verlee Monte, pulled chaplain aside to discuss the mental health situation of her brother, the patient. Verlee Monte stated that the patient has made a statement this morning that he will try to kill himself again when he is released. The patient also threatened Verlee Monte, telling her that he would kill her if she told this to any of the hospital care providers. Chaplain advised Dora to leave the patient's room and sit in the main waiting room. Chaplain also provided emotional support to Dora. Chaplain also notified nurses Berneda Rose and Aundra Millet about the situation. Chaplain will follow up as needed.

## 2011-07-04 NOTE — ED Notes (Signed)
Pt presents to department for evaluation of possible overdose. Pt states he was taking xanax, prescription pain tablets and drinking liquor last night. Sister states she found patient lying in bathtub unresponsive. Pt denies suicidal ideations/homicidal ideations. Pt states "I took my medicine and had a few drinks so that I could go to sleep." pt is alert at the time, answering questions appropriately. Vital signs stable.

## 2011-07-04 NOTE — ED Notes (Signed)
Pt resting quietly at the time. Family at bedside. Vital signs stable. No signs of distress noted.

## 2011-07-04 NOTE — ED Notes (Signed)
Pt returned from x-ray.  PO fluids given at this time.

## 2011-07-04 NOTE — ED Provider Notes (Addendum)
  Physical Exam  BP 112/84  Pulse 86  Temp 97.6 F (36.4 C)  Resp 18  Ht 5\' 9"  (1.753 m)  Wt 200 lb (90.719 kg)  BMI 29.53 kg/m2  SpO2 99%  Physical Exam  ED Course  Procedures  MDM Assumed care from Dr. Deretha Emory.  Heroin overdose, CPR by family, never lost pulse, now awake and alert.  Friends concerned about patient's suicidal intent.  Telepsych pending.  Psychiatry consult complete. Patient diagnosed with antisocial personality disorder and is discharged from a psychiatric standpoint. He denies any suicidal or homicidal thoughts. He is awake alert and oriented x3.  Patient asked if he wanted help to get off drugs. He denies any illicit drug use.    Glynn Octave, MD 07/04/11 0454  Glynn Octave, MD 07/04/11 1027  Glynn Octave, MD 07/04/11 [id number]

## 2011-07-04 NOTE — ED Notes (Signed)
EDP spoke with patient and family. To be discharged home. Denies SI/HI at the time of discharge.

## 2011-07-04 NOTE — ED Notes (Signed)
Pt resting quietly at the time. Pt states pain all over body. Friend at bedside. telepsych to consult.

## 2011-07-04 NOTE — ED Notes (Signed)
Pt to be discharged home per EDP.

## 2011-07-04 NOTE — ED Notes (Signed)
Pt arrived via [facility] c/o overdose. Family found pt and initiated CPR. Upon EMS arrival pt had pulse, and snoring respirations. 18 lt AC. EMS Administered 4 mg Narcan prior to arrival.

## 2011-07-04 NOTE — ED Notes (Signed)
telepsych conference ongoing at the time. RN at the bedside. Vital signs stable. Pt is calm and cooperative at the present.

## 2011-07-05 ENCOUNTER — Emergency Department (HOSPITAL_COMMUNITY): Payer: Self-pay

## 2011-07-05 ENCOUNTER — Emergency Department (HOSPITAL_COMMUNITY)
Admission: EM | Admit: 2011-07-05 | Discharge: 2011-07-05 | Disposition: A | Discharge status: Home / Self Care | Payer: Self-pay | Attending: Emergency Medicine | Admitting: Emergency Medicine

## 2011-07-05 ENCOUNTER — Encounter (HOSPITAL_COMMUNITY): Payer: Self-pay | Admitting: *Deleted

## 2011-07-05 DIAGNOSIS — R300 Dysuria: Secondary | ICD-10-CM | POA: Insufficient documentation

## 2011-07-05 DIAGNOSIS — M129 Arthropathy, unspecified: Secondary | ICD-10-CM | POA: Insufficient documentation

## 2011-07-05 DIAGNOSIS — R112 Nausea with vomiting, unspecified: Secondary | ICD-10-CM | POA: Insufficient documentation

## 2011-07-05 DIAGNOSIS — R109 Unspecified abdominal pain: Secondary | ICD-10-CM | POA: Insufficient documentation

## 2011-07-05 DIAGNOSIS — F17200 Nicotine dependence, unspecified, uncomplicated: Secondary | ICD-10-CM | POA: Insufficient documentation

## 2011-07-05 MED ORDER — NAPROXEN 250 MG PO TABS: 250.0000 mg | ORAL_TABLET | Freq: Two times a day (BID) | ORAL | Status: AC

## 2011-07-05 MED ORDER — KETOROLAC TROMETHAMINE 60 MG/2ML IM SOLN
60.0000 mg | Freq: Once | INTRAMUSCULAR | Status: AC
  Administered 2011-07-05: 60 mg via INTRAMUSCULAR
  Filled 2011-07-05: qty 2

## 2011-07-05 NOTE — ED Notes (Signed)
Pt reports onset of right side flank pain this am, hx of kidney stones. Unable to urinate. Pain 10/10. Reports nausea/vomiting.

## 2011-07-05 NOTE — ED Notes (Signed)
Pt resting. Unable to give urine at this time

## 2011-07-05 NOTE — ED Provider Notes (Signed)
History     CSN: [id number]  Arrival date & time 07/05/11  4098   First MD Initiated Contact with Patient 07/05/11 [phone]      Chief Complaint  Patient presents with  . Flank Pain    HPI Pt was seen at 1000.   Per pt, c/o gradual onset and persistence of constant right sided flank "pain" that began 2d ago.  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  Denies CP/SOB, no N/V/D, no rash, no injury, no fevers, no testicular pain/swelling, no dysuria/hematuria, no abd pain.     Past Medical History  Diagnosis Date  . Arthritis     No past surgical history on file.  History  Substance Use Topics  . Smoking status: Current Everyday Smoker -- 1.0 packs/day  . Smokeless tobacco: Never Used  . Alcohol Use: 0.6 oz/week    1 Cans of beer per week    Review of Systems ROS: Statement: All systems negative except as marked or noted in the HPI; Constitutional: Negative for fever and chills. ; ; Eyes: Negative for eye pain, redness and discharge. ; ; ENMT: Negative for ear pain, hoarseness, nasal congestion, sinus pressure and sore throat. ; ; Cardiovascular: Negative for chest pain, palpitations, diaphoresis, dyspnea and peripheral edema. ; ; Respiratory: Negative for cough, wheezing and stridor. ; ; Gastrointestinal: Negative for nausea, vomiting, diarrhea, abdominal pain, blood in stool, hematemesis, jaundice and rectal bleeding. . ; ; Genitourinary: +right flank pain.  Negative for dysuria, and hematuria.;  Genital:  No penile drainage or rash, no testicular pain or swelling, no scrotal rash or swelling.; ; Musculoskeletal: Negative for back pain and neck pain. Negative for swelling and trauma.; ; Skin: Negative for pruritus, rash, abrasions, blisters, bruising and skin lesion.; ; Neuro: Negative for headache, lightheadedness and neck stiffness. Negative for weakness, altered level of consciousness , altered  mental status, extremity weakness, paresthesias, involuntary movement, seizure and syncope.     Allergies  Review of patient's allergies indicates no known allergies.  Home Medications   Current Outpatient Rx  Name Route Sig Dispense Refill  . HYDROCODONE-ACETAMINOPHEN 5-500 MG PO TABS Oral Take 1 tablet by mouth every 6 (six) hours as needed. For pain    . ADULT MULTIVITAMIN W/MINERALS CH Oral Take 1 tablet by mouth daily.      BP 112/64  Pulse 62  Temp(Src) 98.1 F (36.7 C) (Oral)  Resp 16  Ht 5\' 9"  (1.753 m)  Wt 200 lb (90.719 kg)  BMI 29.53 kg/m2  SpO2 99%  Physical Exam 1005: Physical examination:  Nursing notes reviewed; Vital signs and O2 SAT reviewed;  Constitutional: Well developed, Well nourished, Well hydrated, In no acute distress; Head:  Normocephalic, atraumatic; Eyes: EOMI, PERRL, No scleral icterus; ENMT: Mouth and pharynx normal, Mucous membranes moist; Neck: Supple, Full range of motion, No lymphadenopathy; Cardiovascular: Regular rate and rhythm, No murmur, rub, or gallop; Respiratory: Breath sounds clear & equal bilaterally, No rales, rhonchi, wheezes, or rub, Normal respiratory effort/excursion; Chest: Nontender, Movement normal, no soft tissue crepitus, no ecchymosis, no deformity; Abdomen: Soft, Nontender, Nondistended, Normal bowel sounds, no CVAT bilat, no rash or ecchymosis to right abd or flank; Extremities: Pulses normal, No tenderness, No edema, No calf edema or asymmetry.; Neuro: AA&Ox3, Major CN grossly intact.  No gross focal motor or sensory deficits in extremities.; Skin: Color normal, Warm, Dry, no rash.  ED Course  Procedures    MDM  MDM Reviewed: previous chart, nursing note and vitals Interpretation: x-ray and CT scan     Results for orders placed during the hospital encounter of 07/04/11  CBC      Component Value Range   WBC 12.6 (*) 4.0 - 10.5 (K/uL)   RBC 4.53  4.22 - 5.81 (MIL/uL)   Hemoglobin 13.7  13.0 - 17.0 (g/dL)   HCT  16.1  09.6 - 04.5 (%)   MCV 86.3  78.0 - 100.0 (fL)   MCH 30.2  26.0 - 34.0 (pg)   MCHC 35.0  30.0 - 36.0 (g/dL)   RDW 40.9  81.1 - 91.4 (%)   Platelets 255  150 - 400 (K/uL)  COMPREHENSIVE METABOLIC PANEL      Component Value Range   Sodium 140  135 - 145 (mEq/L)   Potassium 3.3 (*) 3.5 - 5.1 (mEq/L)   Chloride 106  96 - 112 (mEq/L)   CO2 24  19 - 32 (mEq/L)   Glucose, Bld 83  70 - 99 (mg/dL)   BUN 8  6 - 23 (mg/dL)   Creatinine, Ser 7.82  0.50 - 1.35 (mg/dL)   Calcium 9.1  8.4 - 95.6 (mg/dL)   Total Protein 6.6  6.0 - 8.3 (g/dL)   Albumin 3.5  3.5 - 5.2 (g/dL)   AST 18  0 - 37 (U/L)   ALT 15  0 - 53 (U/L)   Alkaline Phosphatase 41  39 - 117 (U/L)   Total Bilirubin 0.3  0.3 - 1.2 (mg/dL)   GFR calc non Af Amer >90  >90 (mL/min)   GFR calc Af Amer >90  >90 (mL/min)  LIPASE, BLOOD      Component Value Range   Lipase 31  11 - 59 (U/L)  ETHANOL      Component Value Range   Alcohol, Ethyl (B) <11  0 - 11 (mg/dL)  ACETAMINOPHEN LEVEL      Component Value Range   Acetaminophen (Tylenol), Serum <15.0  10 - 30 (ug/mL)  SALICYLATE LEVEL      Component Value Range   Salicylate Lvl <2.0 (*) 2.8 - 20.0 (mg/dL)   Ct Abdomen Pelvis Wo Contrast 07/05/2011  *RADIOLOGY REPORT*  Clinical Data: Right flank pain with dysuria, nausea and vomiting for 2 days.  CT ABDOMEN AND PELVIS WITHOUT CONTRAST  Technique:  Multidetector CT imaging of the abdomen and pelvis was performed following the standard protocol without intravenous contrast.  Comparison: CT urogram 01/01/2011.  Findings: Tiny nodular density at the right lung base noted previously is unchanged.  This is associated with the major fissure on the reformatted images and is of doubtful significance.  The lung bases are otherwise clear.  The kidneys remain normal in appearance as imaged in the noncontrast state.  There is no evidence of urinary tract calculus, hydronephrosis or perinephric soft tissue stranding.  The urinary bladder appears mildly  distended without focal abnormality.  As evaluated in the noncontrast state, the liver, spleen, gallbladder, pancreas and adrenal glands appear normal.  There is moderate stool throughout the colon.  The appendix remains normal in appearance.  No inflammatory changes are identified.  Limbus vertebra at L4 and L5 are unchanged.  IMPRESSION:  1.  Stable abdominal pelvic CT.  No acute findings identified. 2.  No evidence of urinary tract calculus, hydronephrosis or perinephric soft tissue stranding.  Original Report Authenticated By: Gerrianne Scale, M.D.   Dg Chest 2 View  07/05/2011  *RADIOLOGY REPORT*  Clinical Data: Chest pain.  Nausea.  Smoking.  CHEST - 2 VIEW  Comparison: 07/04/2011  Findings: Cardiomediastinal silhouette is within normal limits. The lungs are free of focal consolidations and pleural effusions. There is prominence of perihilar markings.  No edema.  Degenerative changes are seen in the spine.  IMPRESSION:  1.  Bronchitic changes. 2. No focal pulmonary abnormality.  Original Report Authenticated By: Patterson Hammersmith, M.D.     12:28 PM:  Pt's CT scan today neg for acute process.  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  Verb understanding.  Pt does not want narc detox.  Will dose toradol IM and d/c home with outpt f/u.          Laray Anger, DO 07/07/11 1742

## 2011-07-05 NOTE — ED Notes (Signed)
Pt. Is still unable to use the restroom at this time.  

## 2016-10-20 ENCOUNTER — Emergency Department (HOSPITAL_COMMUNITY)
Admission: EM | Admit: 2016-10-20 | Discharge: 2016-10-20 | Disposition: A | Discharge status: Home / Self Care | Payer: Self-pay | Attending: Emergency Medicine | Admitting: Emergency Medicine

## 2016-10-20 ENCOUNTER — Emergency Department (HOSPITAL_COMMUNITY): Payer: Self-pay

## 2016-10-20 ENCOUNTER — Encounter (HOSPITAL_COMMUNITY): Payer: Self-pay

## 2016-10-20 DIAGNOSIS — R51 Headache: Secondary | ICD-10-CM | POA: Insufficient documentation

## 2016-10-20 DIAGNOSIS — R112 Nausea with vomiting, unspecified: Secondary | ICD-10-CM | POA: Insufficient documentation

## 2016-10-20 DIAGNOSIS — R319 Hematuria, unspecified: Secondary | ICD-10-CM | POA: Insufficient documentation

## 2016-10-20 DIAGNOSIS — F17200 Nicotine dependence, unspecified, uncomplicated: Secondary | ICD-10-CM | POA: Insufficient documentation

## 2016-10-20 DIAGNOSIS — R1031 Right lower quadrant pain: Secondary | ICD-10-CM | POA: Insufficient documentation

## 2016-10-20 LAB — CBC WITH DIFFERENTIAL/PLATELET
BASOS PCT: 0 %
Basophils Absolute: 0 10*3/uL (ref 0.0–0.1)
EOS ABS: 0.2 10*3/uL (ref 0.0–0.7)
Eosinophils Relative: 2 %
HCT: 40.4 % (ref 39.0–52.0)
Hemoglobin: 14.1 g/dL (ref 13.0–17.0)
Lymphocytes Relative: 27 %
Lymphs Abs: 2.8 10*3/uL (ref 0.7–4.0)
MCH: 30.1 pg (ref 26.0–34.0)
MCHC: 34.9 g/dL (ref 30.0–36.0)
MCV: 86.1 fL (ref 78.0–100.0)
MONOS PCT: 7 %
Monocytes Absolute: 0.7 10*3/uL (ref 0.1–1.0)
NEUTROS PCT: 64 %
Neutro Abs: 6.5 10*3/uL (ref 1.7–7.7)
PLATELETS: 315 10*3/uL (ref 150–400)
RBC: 4.69 MIL/uL (ref 4.22–5.81)
RDW: 12.4 % (ref 11.5–15.5)
WBC: 10.2 10*3/uL (ref 4.0–10.5)

## 2016-10-20 LAB — URINALYSIS, ROUTINE W REFLEX MICROSCOPIC
BILIRUBIN URINE: NEGATIVE
Glucose, UA: NEGATIVE mg/dL
KETONES UR: NEGATIVE mg/dL
LEUKOCYTES UA: NEGATIVE
Nitrite: NEGATIVE
PH: 5 (ref 5.0–8.0)
Protein, ur: 100 mg/dL — AB
SQUAMOUS EPITHELIAL / LPF: NONE SEEN
Specific Gravity, Urine: 1.034 — ABNORMAL HIGH (ref 1.005–1.030)

## 2016-10-20 LAB — COMPREHENSIVE METABOLIC PANEL
ALT: 28 U/L (ref 17–63)
AST: 25 U/L (ref 15–41)
Albumin: 3.7 g/dL (ref 3.5–5.0)
Alkaline Phosphatase: 59 U/L (ref 38–126)
Anion gap: 10 (ref 5–15)
BUN: 15 mg/dL (ref 6–20)
CHLORIDE: 106 mmol/L (ref 101–111)
CO2: 24 mmol/L (ref 22–32)
CREATININE: 1.02 mg/dL (ref 0.61–1.24)
Calcium: 9.2 mg/dL (ref 8.9–10.3)
Glucose, Bld: 117 mg/dL — ABNORMAL HIGH (ref 65–99)
POTASSIUM: 3.6 mmol/L (ref 3.5–5.1)
Sodium: 140 mmol/L (ref 135–145)
Total Bilirubin: 0.4 mg/dL (ref 0.3–1.2)
Total Protein: 7.3 g/dL (ref 6.5–8.1)

## 2016-10-20 LAB — LIPASE, BLOOD: Lipase: 24 U/L (ref 11–51)

## 2016-10-20 MED ORDER — ONDANSETRON HCL 4 MG/2ML IJ SOLN
4.0000 mg | Freq: Once | INTRAMUSCULAR | Status: AC
  Administered 2016-10-20: 4 mg via INTRAVENOUS
  Filled 2016-10-20: qty 2

## 2016-10-20 MED ORDER — SODIUM CHLORIDE 0.9 % IV SOLN
INTRAVENOUS | Status: DC
  Administered 2016-10-20: 21:00:00 via INTRAVENOUS

## 2016-10-20 MED ORDER — SODIUM CHLORIDE 0.9 % IV BOLUS (SEPSIS)
250.0000 mL | Freq: Once | INTRAVENOUS | Status: AC
  Administered 2016-10-20: 250 mL via INTRAVENOUS

## 2016-10-20 MED ORDER — CEPHALEXIN 500 MG PO CAPS: 500.0000 mg | ORAL_CAPSULE | Freq: Four times a day (QID) | ORAL | 0 refills | Status: AC

## 2016-10-20 MED ORDER — FENTANYL CITRATE (PF) 100 MCG/2ML IJ SOLN
50.0000 ug | Freq: Once | INTRAMUSCULAR | Status: AC
  Administered 2016-10-20: 50 ug via INTRAVENOUS
  Filled 2016-10-20: qty 2

## 2016-10-20 MED ORDER — DEXTROSE 5 % IV SOLN
1.0000 g | Freq: Once | INTRAVENOUS | Status: AC
  Administered 2016-10-20: 1 g via INTRAVENOUS
  Filled 2016-10-20: qty 10

## 2016-10-20 MED ORDER — KETOROLAC TROMETHAMINE 30 MG/ML IJ SOLN
30.0000 mg | Freq: Once | INTRAMUSCULAR | Status: DC
  Filled 2016-10-20: qty 1

## 2016-10-20 NOTE — ED Provider Notes (Signed)
[facility]-EMERGENCY DEPT Provider Note   CSN: [id number] Arrival date & time: 10/20/16  1950   By signing my name below, I, Diona BrownerJennifer Gorman, attest that this documentation has been prepared under the direction and in the presence of Vanetta MuldersZackowski, Danyel Tobey, MD. Electronically Signed: Diona BrownerJennifer Gorman, ED Scribe. 10/20/16. 8:22 PM.   History   Chief Complaint Chief Complaint  Patient presents with  . Flank Pain    HPI Orie RoutBilly M Carpenito is a 41 y.o. male who presents to the Emergency Department complaining of gradually worsening, shooting, right flank pain that started yesterday. Pt rates pain a 9/10 severity and states it radiates to his groin. Associated sx include nausea, vomiting, hematuria and dysuria. He notes having similar sx a few years back, but he can't remember if he had a kidney stone or not. Pt denies nausea, diarrhea and fever.  The history is provided by the patient. No language interpreter was used.  Flank Pain  This is a new problem. The current episode started yesterday. The problem occurs constantly. The problem has been gradually worsening. Associated symptoms include abdominal pain and headaches. Pertinent negatives include no chest pain.    Past Medical History:  Diagnosis Date  . Arthritis     There are no active problems to display for this patient.   History reviewed. No pertinent surgical history.     Home Medications    Prior to Admission medications   Medication Sig Start Date End Date Taking? Authorizing Provider  cephALEXin (KEFLEX) 500 MG capsule Take 1 capsule (500 mg total) by mouth 4 (four) times daily. 10/20/16   Vanetta MuldersZackowski, Darnel Mchan, MD    Family History No family history on file.  Social History Social History  Substance Use Topics  . Smoking status: Current Every Day Smoker    Packs/day: 0.50  . Smokeless tobacco: Never Used  . Alcohol use No     Allergies   Patient has no known allergies.   Review of Systems Review of Systems    Constitutional: Negative for fever.  HENT: Negative for rhinorrhea.   Eyes: Negative for visual disturbance.  Respiratory: Negative for cough.   Cardiovascular: Negative for chest pain.  Gastrointestinal: Positive for abdominal pain, nausea and vomiting. Negative for diarrhea.  Genitourinary: Positive for dysuria, flank pain and hematuria.  Musculoskeletal: Positive for back pain. Negative for joint swelling.  Skin: Negative for rash.  Neurological: Positive for headaches.  Hematological: Does not bruise/bleed easily.  Psychiatric/Behavioral: Negative for confusion.     Physical Exam Updated Vital Signs BP 134/82 (BP Location: Right Arm)   Pulse 68   Temp 98.6 F (37 C) (Oral)   Resp 20   Ht 1.753 m (5\' 9" )   Wt 102.1 kg (225 lb)   SpO2 100%   BMI 33.23 kg/m   Physical Exam  Constitutional: He is oriented to person, place, and time. He appears well-developed and well-nourished.  HENT:  Head: Normocephalic and atraumatic.  Mouth/Throat: Oropharynx is clear and moist.  Eyes: Conjunctivae and EOM are normal. Pupils are equal, round, and reactive to light.  Neck: Normal range of motion.  Cardiovascular: Normal rate, regular rhythm, normal heart sounds and intact distal pulses.  Exam reveals no gallop and no friction rub.   No murmur heard. Pulmonary/Chest: Effort normal and breath sounds normal.  Abdominal: Bowel sounds are normal. He exhibits no distension. There is tenderness.  Tenderness to RLQ, right flank, and right abdomen  Musculoskeletal: Normal range of motion.  No edema to bilateral feet  and toes.  Neurological: He is alert and oriented to person, place, and time. No cranial nerve deficit or sensory deficit. He exhibits normal muscle tone. Coordination normal.  Psychiatric: He has a normal mood and affect.  Nursing note and vitals reviewed.    ED Treatments / Results  DIAGNOSTIC STUDIES: Oxygen Saturation is 97% on RA, normal by my interpretation.    COORDINATION OF CARE: 8:22 PM-Discussed next steps with pt. Pt verbalized understanding and is agreeable with the plan.    Labs (all labs ordered are listed, but only abnormal results are displayed)  Results for orders placed or performed during the hospital encounter of 10/20/16  Urinalysis, Routine w reflex microscopic- may I&O cath if menses  Result Value Ref Range   Color, Urine YELLOW YELLOW   APPearance CLOUDY (A) CLEAR   Specific Gravity, Urine 1.034 (H) 1.005 - 1.030   pH 5.0 5.0 - 8.0   Glucose, UA NEGATIVE NEGATIVE mg/dL   Hgb urine dipstick LARGE (A) NEGATIVE   Bilirubin Urine NEGATIVE NEGATIVE   Ketones, ur NEGATIVE NEGATIVE mg/dL   Protein, ur 578 (A) NEGATIVE mg/dL   Nitrite NEGATIVE NEGATIVE   Leukocytes, UA NEGATIVE NEGATIVE   RBC / HPF TOO NUMEROUS TO COUNT 0 - 5 RBC/hpf   WBC, UA 0-5 0 - 5 WBC/hpf   Bacteria, UA RARE (A) NONE SEEN   Squamous Epithelial / LPF NONE SEEN NONE SEEN   Mucous PRESENT   CBC with Differential/Platelet  Result Value Ref Range   WBC 10.2 4.0 - 10.5 K/uL   RBC 4.69 4.22 - 5.81 MIL/uL   Hemoglobin 14.1 13.0 - 17.0 g/dL   HCT 46.9 62.9 - 52.8 %   MCV 86.1 78.0 - 100.0 fL   MCH 30.1 26.0 - 34.0 pg   MCHC 34.9 30.0 - 36.0 g/dL   RDW 41.3 24.4 - 01.0 %   Platelets 315 150 - 400 K/uL   Neutrophils Relative % 64 %   Neutro Abs 6.5 1.7 - 7.7 K/uL   Lymphocytes Relative 27 %   Lymphs Abs 2.8 0.7 - 4.0 K/uL   Monocytes Relative 7 %   Monocytes Absolute 0.7 0.1 - 1.0 K/uL   Eosinophils Relative 2 %   Eosinophils Absolute 0.2 0.0 - 0.7 K/uL   Basophils Relative 0 %   Basophils Absolute 0.0 0.0 - 0.1 K/uL  Comprehensive metabolic panel  Result Value Ref Range   Sodium 140 135 - 145 mmol/L   Potassium 3.6 3.5 - 5.1 mmol/L   Chloride 106 101 - 111 mmol/L   CO2 24 22 - 32 mmol/L   Glucose, Bld 117 (H) 65 - 99 mg/dL   BUN 15 6 - 20 mg/dL   Creatinine, Ser 2.72 0.61 - 1.24 mg/dL   Calcium 9.2 8.9 - 53.6 mg/dL   Total Protein 7.3 6.5  - 8.1 g/dL   Albumin 3.7 3.5 - 5.0 g/dL   AST 25 15 - 41 U/L   ALT 28 17 - 63 U/L   Alkaline Phosphatase 59 38 - 126 U/L   Total Bilirubin 0.4 0.3 - 1.2 mg/dL   GFR calc non Af Amer >60 >60 mL/min   GFR calc Af Amer >60 >60 mL/min   Anion gap 10 5 - 15  Lipase, blood  Result Value Ref Range   Lipase 24 11 - 51 U/L   Ct Renal Stone Study  Result Date: 10/20/2016 CLINICAL DATA:  41 y/o  M; right flank pain. EXAM: CT ABDOMEN  AND PELVIS WITHOUT CONTRAST TECHNIQUE: Multidetector CT imaging of the abdomen and pelvis was performed following the standard protocol without IV contrast. COMPARISON:  07/05/2011 CT abdomen and pelvis. FINDINGS: Lower chest: No acute abnormality. Hepatobiliary: No focal liver abnormality is seen. No gallstones, gallbladder wall thickening, or biliary dilatation. Pancreas: Unremarkable. No pancreatic ductal dilatation or surrounding inflammatory changes. Spleen: Normal in size without focal abnormality. Adrenals/Urinary Tract: Adrenal glands are unremarkable. Kidneys are normal, without renal calculi, focal lesion, or hydronephrosis. Bladder is unremarkable. Stomach/Bowel: Stomach is within normal limits. Appendix appears normal. No evidence of bowel wall thickening, distention, or inflammatory changes. Vascular/Lymphatic: No significant vascular findings are present. No enlarged abdominal or pelvic lymph nodes. Reproductive: Uterus and bilateral adnexa are unremarkable. Other: No abdominal wall hernia or abnormality. No abdominopelvic ascites. Musculoskeletal: No acute or significant osseous findings. IMPRESSION: No acute process identified as explanation for pain. No urinary stone or hydronephrosis. Unremarkable CT of abdomen and pelvis. Electronically Signed   By: Mitzi Hansen M.D.   On: 10/20/2016 21:31    Procedures Procedures (including critical care time)  Medications Ordered in ED  Medications  0.9 %  sodium chloride infusion ( Intravenous New Bag/Given  10/20/16 2107)  ketorolac (TORADOL) 30 MG/ML injection 30 mg (30 mg Intravenous Not Given 10/20/16 2107)  sodium chloride 0.9 % bolus 250 mL (0 mLs Intravenous Stopped 10/20/16 2200)  ondansetron (ZOFRAN) injection 4 mg (4 mg Intravenous Given 10/20/16 2107)  fentaNYL (SUBLIMAZE) injection 50 mcg (50 mcg Intravenous Given 10/20/16 2107)  cefTRIAXone (ROCEPHIN) 1 g in dextrose 5 % 50 mL IVPB (0 g Intravenous Stopped 10/20/16 2241)      Initial Impression / Assessment and Plan / ED Course  I have reviewed the triage vital signs and the nursing notes.  Pertinent labs & imaging results that were available during my care of the patient were reviewed by me and considered in my medical decision making (see chart for details).     Workup significant for the finding of hematuria. No evidence of any kidney stones or ureteral stones. Hematuria may represent infection. Urine culture sent. Patient given Rocephin here will continue her Keflex. Outpatient follow-up with urology. Patient nontoxic no acute distress.  Final Clinical Impressions(s) / ED Diagnoses   Final diagnoses:  Hematuria, unspecified type    New Prescriptions New Prescriptions   CEPHALEXIN (KEFLEX) 500 MG CAPSULE    Take 1 capsule (500 mg total) by mouth 4 (four) times daily.   I personally performed the services described in this documentation, which was scribed in my presence. The recorded information has been reviewed and is accurate.       Vanetta Mulders, MD 10/20/16 [phone]

## 2016-10-20 NOTE — ED Triage Notes (Signed)
Pt reports right sided flank pain that radiates to groin that started yesterday accompanied by hematuria, dysuria, frequency, and urgency. Pt afebrile.

## 2016-10-20 NOTE — ED Notes (Signed)
Pt with hx of kidney stones.  + N/V x1 today, + burning on urination

## 2016-10-20 NOTE — Discharge Instructions (Signed)
CT scan without any evidence of kidney stones. Does show blood in the urine will need to follow-up with urology.: Make an appointment. Take the antibiotic Keflex as directed. Urine is been sent for culture to try to rule out any signs of infection. Would expect improvement if related to infection in the next 24 hours.

## 2016-10-20 NOTE — ED Notes (Signed)
Pt a&o x 4, vs stable, RX/verbal/written discharge instructions given, pt and spouse verbalized understanding, pt ambulated off unit with steady gait in stable condition at this time

## 2016-10-20 NOTE — ED Notes (Signed)
Patient transported to CT 

## 2016-10-22 LAB — URINE CULTURE: Culture: <10000 — AB

## 2021-10-29 DEATH — deceased
